# Patient Record
Sex: Female | Born: 1980 | Race: White | Hispanic: No | Marital: Single | Smoking: Never smoker
Health system: Southern US, Community
[De-identification: ages and names within clinical notes are randomized; demographics above are authoritative.]

## PROBLEM LIST (undated history)

## (undated) DIAGNOSIS — E079 Disorder of thyroid, unspecified: Secondary | ICD-10-CM

## (undated) DIAGNOSIS — I10 Essential (primary) hypertension: Secondary | ICD-10-CM

## (undated) DIAGNOSIS — F419 Anxiety disorder, unspecified: Secondary | ICD-10-CM

## (undated) DIAGNOSIS — O1490 Unspecified pre-eclampsia, unspecified trimester: Secondary | ICD-10-CM

---

## 2013-08-10 ENCOUNTER — Emergency Department: Payer: Self-pay | Admitting: Emergency Medicine

## 2018-02-24 ENCOUNTER — Emergency Department
Admission: EM | Admit: 2018-02-24 | Discharge: 2018-02-24 | Disposition: A | Discharge status: Home / Self Care | Payer: Federal, State, Local not specified - PPO | Attending: Emergency Medicine | Admitting: Emergency Medicine

## 2018-02-24 ENCOUNTER — Other Ambulatory Visit: Payer: Self-pay

## 2018-02-24 ENCOUNTER — Emergency Department: Payer: Federal, State, Local not specified - PPO

## 2018-02-24 DIAGNOSIS — Z5321 Procedure and treatment not carried out due to patient leaving prior to being seen by health care provider: Secondary | ICD-10-CM | POA: Diagnosis not present

## 2018-02-24 DIAGNOSIS — R079 Chest pain, unspecified: Secondary | ICD-10-CM | POA: Insufficient documentation

## 2018-02-24 HISTORY — DX: Disorder of thyroid, unspecified: E07.9

## 2018-02-24 HISTORY — DX: Essential (primary) hypertension: I10

## 2018-02-24 HISTORY — DX: Unspecified pre-eclampsia, unspecified trimester: O14.90

## 2018-02-24 HISTORY — DX: Anxiety disorder, unspecified: F41.9

## 2018-02-24 LAB — BASIC METABOLIC PANEL
Anion gap: 12 (ref 5–15)
BUN: 11 mg/dL (ref 6–20)
CALCIUM: 8.7 mg/dL — AB (ref 8.9–10.3)
CO2: 24 mmol/L (ref 22–32)
Chloride: 108 mmol/L (ref 98–111)
Creatinine, Ser: 0.45 mg/dL (ref 0.44–1.00)
GFR calc non Af Amer: >60 mL/min (ref >60)
Glucose, Bld: 105 mg/dL — ABNORMAL HIGH (ref 70–99)
Potassium: 3.2 mmol/L — ABNORMAL LOW (ref 3.5–5.1)
Sodium: 144 mmol/L (ref 135–145)

## 2018-02-24 LAB — POCT PREGNANCY, URINE: Preg Test, Ur: NEGATIVE

## 2018-02-24 LAB — CBC
HCT: 43.7 % (ref 36.0–46.0)
Hemoglobin: 15.3 g/dL — ABNORMAL HIGH (ref 12.0–15.0)
MCH: 31.4 pg (ref 26.0–34.0)
MCHC: 35 g/dL (ref 30.0–36.0)
MCV: 89.7 fL (ref 80.0–100.0)
PLATELETS: 183 10*3/uL (ref 150–400)
RBC: 4.87 MIL/uL (ref 3.87–5.11)
RDW: 11.8 % (ref 11.5–15.5)
WBC: 7.4 10*3/uL (ref 4.0–10.5)
nRBC: 0 % (ref 0.0–0.2)

## 2018-02-24 LAB — TROPONIN I: Troponin I: <0.03 ng/mL (ref <0.03)

## 2018-02-24 NOTE — ED Notes (Signed)
No answer when called from lobby x3. 

## 2018-02-24 NOTE — ED Triage Notes (Signed)
Pt arrives to ED via [facility] from home with c/o non-radiating upper left-sided chest pain x3 weeks. EMS reports pt has been "doubling up on her BP meds x2 weeks" d/t family stress. Pt reports (+) N/V "typically in the morning but I'm not pregnant". Pt reports 2 shots of ETOH used PTA, but pt appears to be very intoxicated and slurring her words. Father present at this time.

## 2018-02-24 NOTE — ED Notes (Signed)
No answer when called from lobby x1 

## 2018-02-24 NOTE — ED Notes (Signed)
No answer when called from lobby x2. 

## 2020-02-24 IMAGING — CR DG CHEST 2V
2 series · 2 of 2 positions shown · non-contrast
Comparison: None.

CLINICAL DATA: Left-sided chest pain for several weeks

EXAM:
CHEST - 2 VIEW

[chest pa]
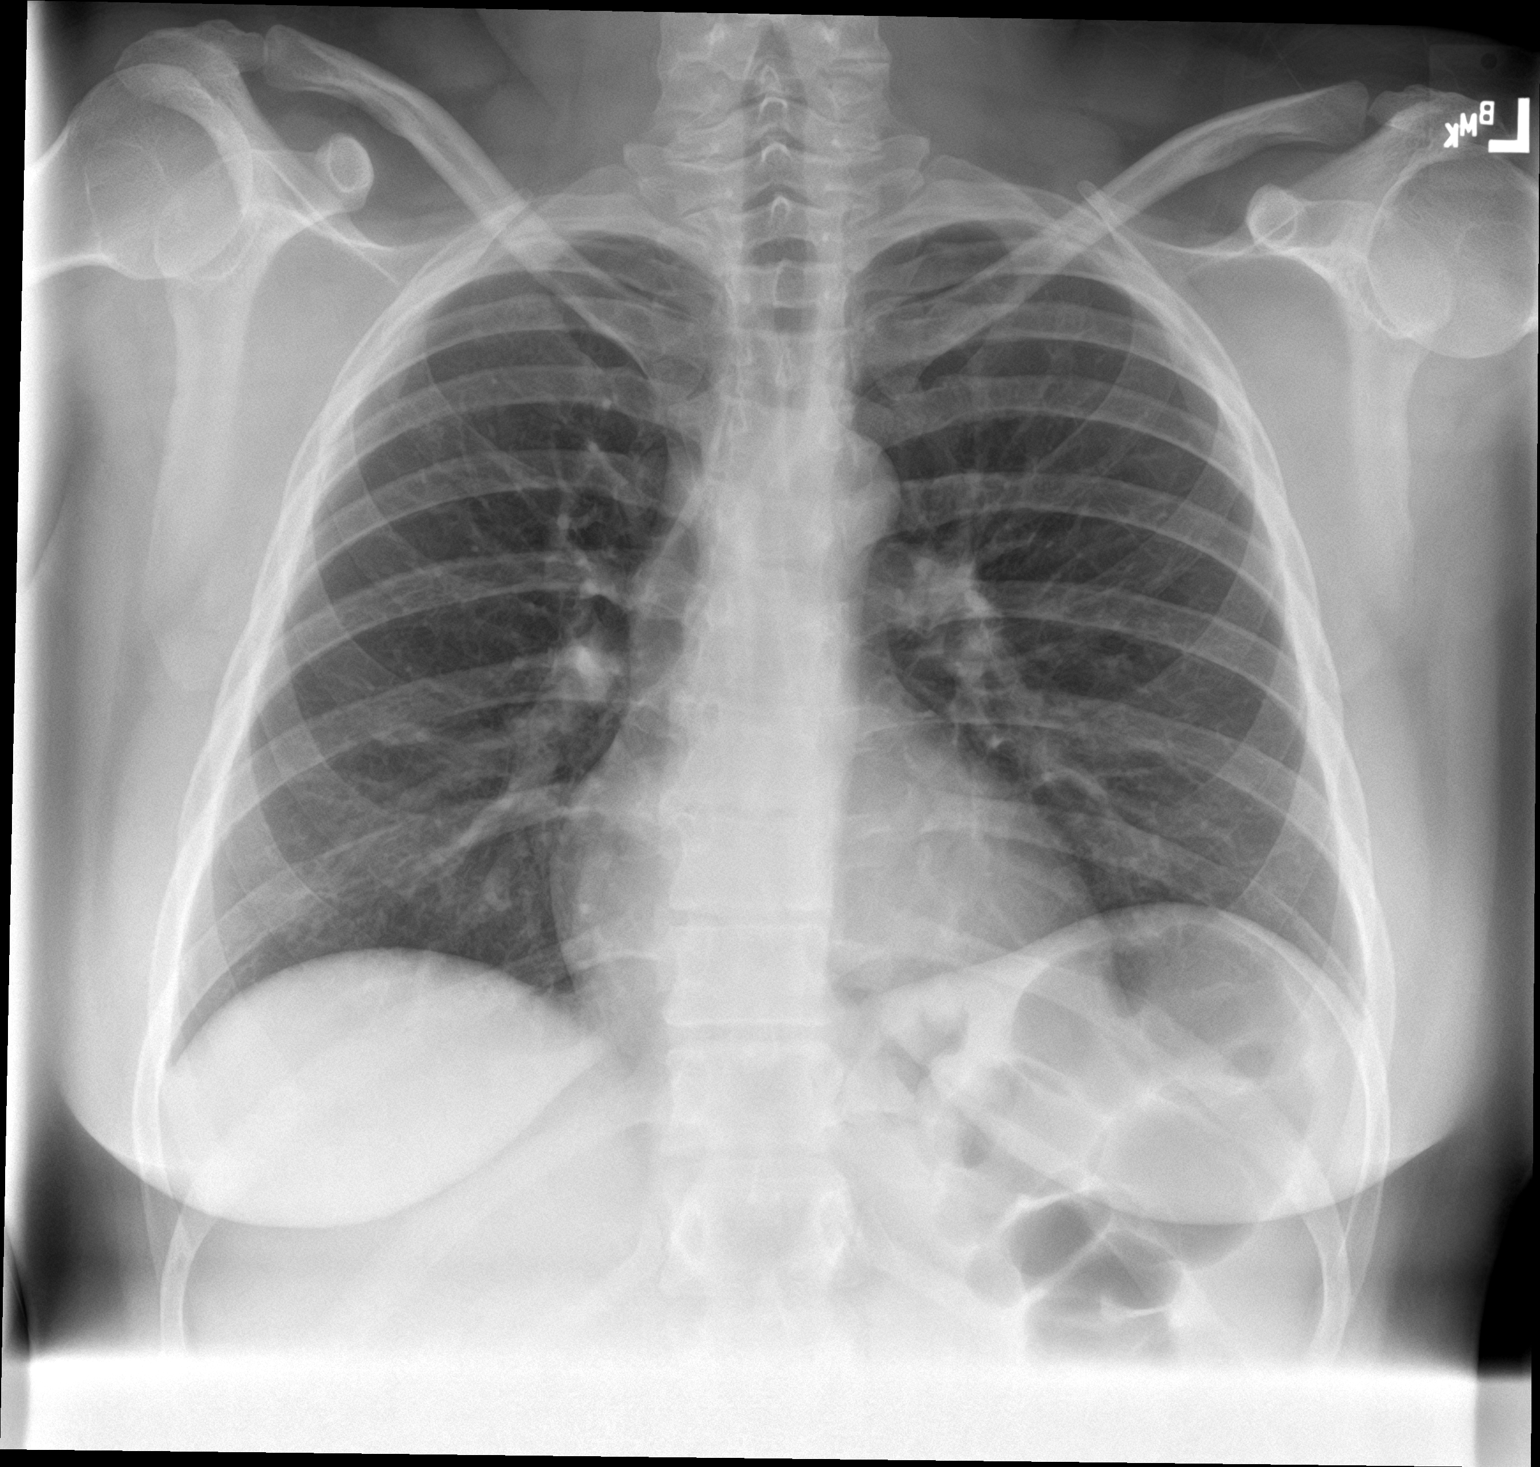

[chest lat]
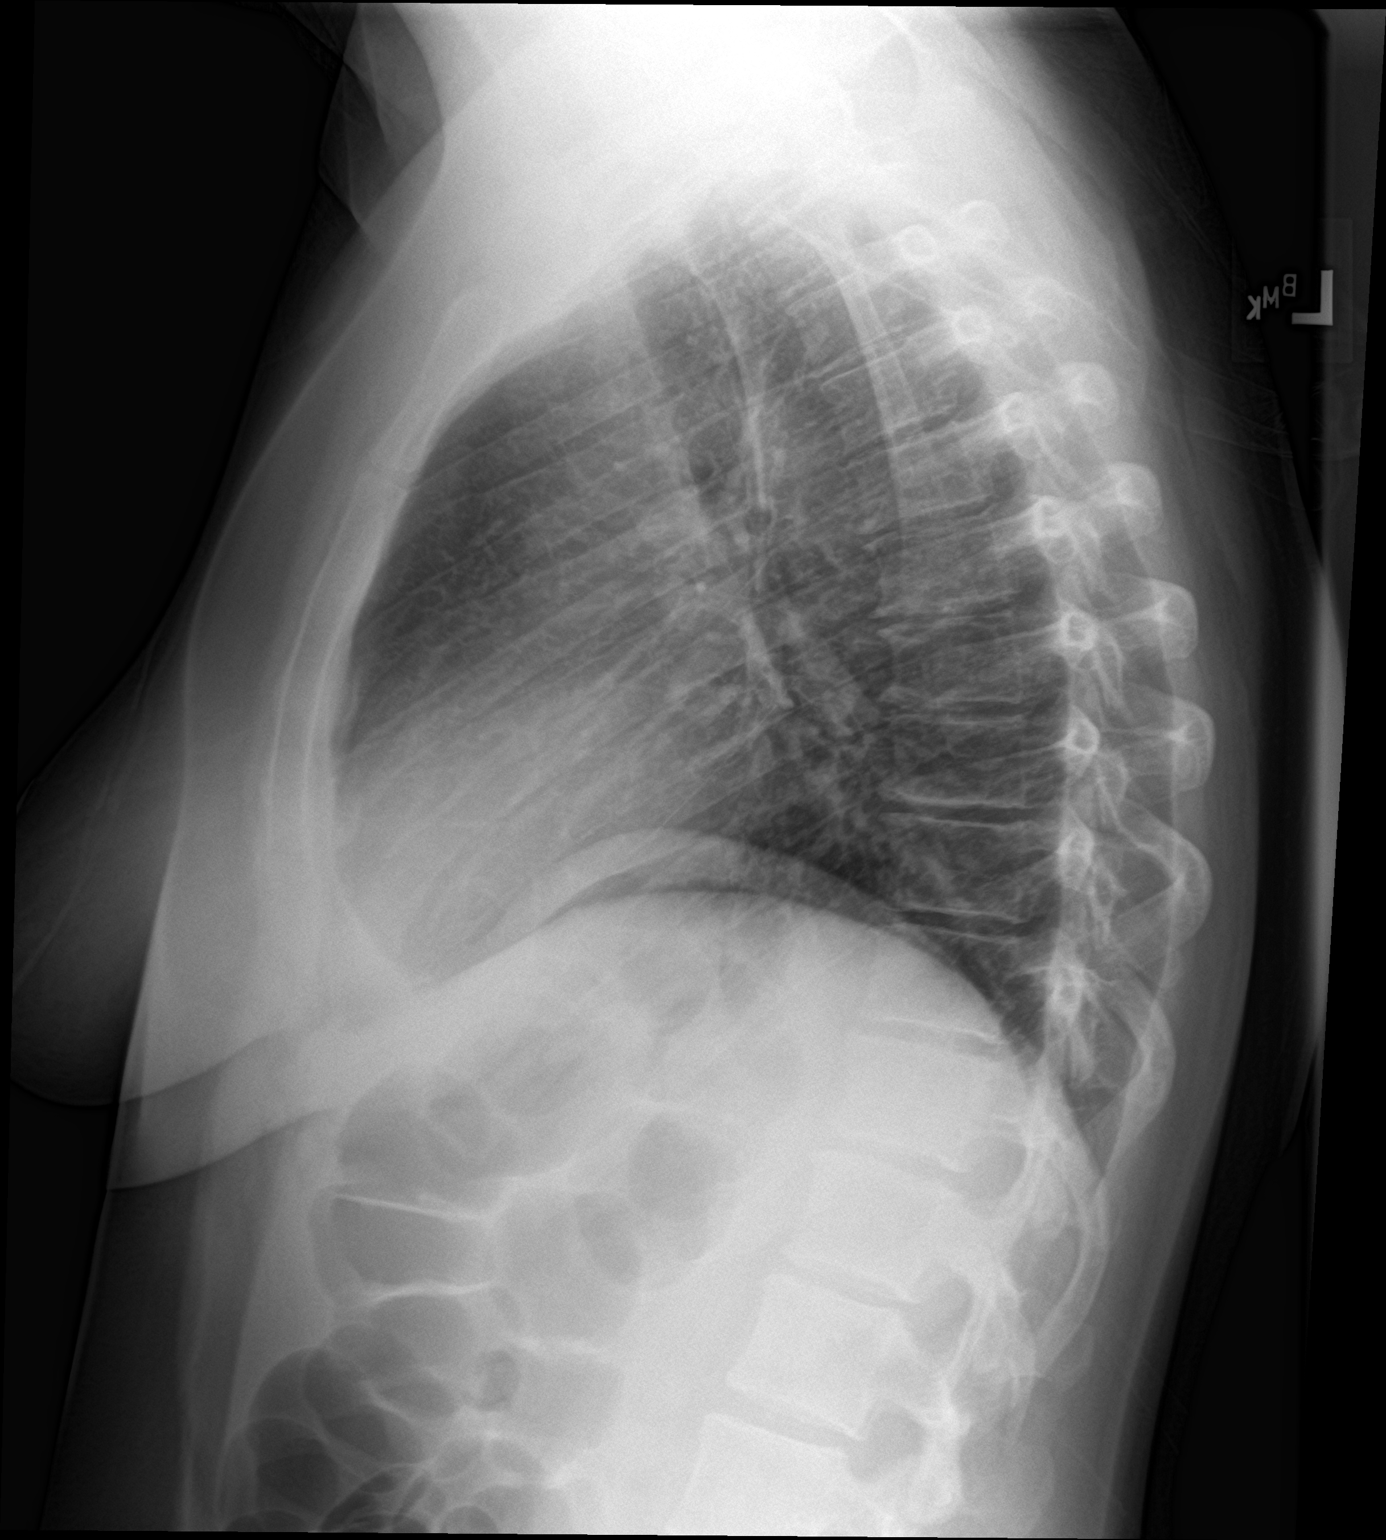

[2 of 2 positions shown; findings below may reference images not displayed]

FINDINGS: The heart size and mediastinal contours are within normal limits.
Both lungs are clear. The visualized skeletal structures are
unremarkable.
IMPRESSION: No active cardiopulmonary disease.
